# Patient Record
Sex: Male | Born: 1985 | Race: White | Hispanic: No | Marital: Single | Smoking: Former smoker
Health system: Southern US, Community
[De-identification: ages and names within clinical notes are randomized; demographics above are authoritative.]

## PROBLEM LIST (undated history)

## (undated) DIAGNOSIS — R569 Unspecified convulsions: Secondary | ICD-10-CM

---

## 2010-10-19 HISTORY — PX: BACK SURGERY: SHX140

## 2016-03-19 ENCOUNTER — Emergency Department (HOSPITAL_COMMUNITY)
Admission: EM | Admit: 2016-03-19 | Discharge: 2016-03-19 | Disposition: A | Discharge status: Home / Self Care | Payer: BLUE CROSS/BLUE SHIELD | Attending: Emergency Medicine | Admitting: Emergency Medicine

## 2016-03-19 ENCOUNTER — Encounter (HOSPITAL_COMMUNITY): Payer: Self-pay

## 2016-03-19 ENCOUNTER — Emergency Department (HOSPITAL_COMMUNITY): Payer: BLUE CROSS/BLUE SHIELD

## 2016-03-19 DIAGNOSIS — Y939 Activity, unspecified: Secondary | ICD-10-CM | POA: Insufficient documentation

## 2016-03-19 DIAGNOSIS — R569 Unspecified convulsions: Secondary | ICD-10-CM | POA: Diagnosis present

## 2016-03-19 DIAGNOSIS — S0003XA Contusion of scalp, initial encounter: Secondary | ICD-10-CM | POA: Insufficient documentation

## 2016-03-19 DIAGNOSIS — Y990 Civilian activity done for income or pay: Secondary | ICD-10-CM | POA: Insufficient documentation

## 2016-03-19 DIAGNOSIS — Y92510 Bank as the place of occurrence of the external cause: Secondary | ICD-10-CM | POA: Insufficient documentation

## 2016-03-19 DIAGNOSIS — X58XXXA Exposure to other specified factors, initial encounter: Secondary | ICD-10-CM | POA: Diagnosis not present

## 2016-03-19 DIAGNOSIS — R4020 Unspecified coma: Secondary | ICD-10-CM

## 2016-03-19 DIAGNOSIS — R55 Syncope and collapse: Secondary | ICD-10-CM | POA: Diagnosis not present

## 2016-03-19 HISTORY — DX: Unspecified convulsions: R56.9

## 2016-03-19 LAB — CBC WITH DIFFERENTIAL/PLATELET
BASOS ABS: 0 10*3/uL (ref 0.0–0.1)
Basophils Relative: 0 %
Eosinophils Absolute: 0.1 10*3/uL (ref 0.0–0.7)
Eosinophils Relative: 1 %
HEMATOCRIT: 41.7 % (ref 39.0–52.0)
HEMOGLOBIN: 14.6 g/dL (ref 13.0–17.0)
LYMPHS ABS: 1.3 10*3/uL (ref 0.7–4.0)
LYMPHS PCT: 11 %
MCH: 32.6 pg (ref 26.0–34.0)
MCHC: 35 g/dL (ref 30.0–36.0)
MCV: 93.1 fL (ref 78.0–100.0)
Monocytes Absolute: 0.4 10*3/uL (ref 0.1–1.0)
Monocytes Relative: 4 %
NEUTROS ABS: 9.7 10*3/uL — AB (ref 1.7–7.7)
NEUTROS PCT: 84 %
PLATELETS: 272 10*3/uL (ref 150–400)
RBC: 4.48 MIL/uL (ref 4.22–5.81)
RDW: 12.5 % (ref 11.5–15.5)
WBC: 11.5 10*3/uL — AB (ref 4.0–10.5)

## 2016-03-19 LAB — BASIC METABOLIC PANEL
ANION GAP: 8 (ref 5–15)
BUN: 9 mg/dL (ref 6–20)
CHLORIDE: 104 mmol/L (ref 101–111)
CO2: 23 mmol/L (ref 22–32)
Calcium: 9 mg/dL (ref 8.9–10.3)
Creatinine, Ser: 0.9 mg/dL (ref 0.61–1.24)
GFR calc Af Amer: >60 mL/min (ref >60)
Glucose, Bld: 99 mg/dL (ref 65–99)
POTASSIUM: 3.8 mmol/L (ref 3.5–5.1)
SODIUM: 135 mmol/L (ref 135–145)

## 2016-03-19 LAB — CBG MONITORING, ED: GLUCOSE-CAPILLARY: 107 mg/dL — AB (ref 65–99)

## 2016-03-19 MED ORDER — KETOROLAC TROMETHAMINE 30 MG/ML IJ SOLN
30.0000 mg | Freq: Once | INTRAMUSCULAR | Status: AC
  Administered 2016-03-19: 30 mg via INTRAVENOUS
  Filled 2016-03-19: qty 1

## 2016-03-19 MED ORDER — ONDANSETRON HCL 4 MG/2ML IJ SOLN
4.0000 mg | Freq: Once | INTRAMUSCULAR | Status: AC
  Administered 2016-03-19: 4 mg via INTRAVENOUS
  Filled 2016-03-19: qty 2

## 2016-03-19 MED ORDER — FENTANYL CITRATE (PF) 100 MCG/2ML IJ SOLN
50.0000 ug | Freq: Once | INTRAMUSCULAR | Status: AC
  Administered 2016-03-19: 50 ug via INTRAVENOUS
  Filled 2016-03-19: qty 2

## 2016-03-19 NOTE — ED Notes (Signed)
Bed: WA08 Expected date:  Expected time:  Means of arrival:  Comments: EMS- 30yo M, seizure/head injury

## 2016-03-19 NOTE — ED Notes (Signed)
Pt ambulated in hall with no assist. 

## 2016-03-19 NOTE — ED Notes (Signed)
Per EMS- patient was at work and had a seizure that lasted 1-2 minutes as per co-workers. Ptient has a history of one other seizure when he was a teenager and was never placed on medication. Patient has a hematoma to the back of his head. Patient awake and alert at this time.

## 2016-03-19 NOTE — ED Provider Notes (Signed)
CSN: [id number]     Arrival date & time 03/19/16  1221 History   First MD Initiated Contact with Patient 03/19/16 1234     Chief Complaint  Patient presents with  . Seizures    HPI   Randall Benitez is an 30 y.o. male who presents to the ED for evaluation after a presumed seizure. 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 He states he does not remember the event at all. He states all he remembers is feeling "off" all morning with some generalized weakness and lightheadedness. He states yesterday he felt fine. He states he thinks he was sitting at a desk and the next thing he remembers is waking up on the stretcher being wheeled to the ambulance. He states he felt tired and weak at tha ttime but denies confusion. He states something similar has happened once before about seven years ago and he was told he had a seizure then so he assumed he had a seizure today. He states that at that time he is unsure if he was evaluated by neurology and he was never started on seizure medicine. Per EMS, co-workers witnessed the event today and reported 1-2 minutes of what appeared to be seizure-like activity. EMS also reported that pt was post-ictal on arrival. In the ED now pt states he has a headache as he thinks he hit his head on something. States he has an area of swelling to his right pareitoccipital area. States now the pain is radiating to his forehead. Denies blurry vision, weakness, numbness, dizziness. Denies abdominal pain, n/v/d. States in the ED now he feels back to baseline.   Past Medical History  Diagnosis Date  . Seizures [facility])    Past Surgical History  Procedure Laterality Date  . Back surgery     History reviewed. No pertinent family history. Social History  Substance Use Topics  . Smoking status: Never Smoker   . Smokeless tobacco: Never Used  . Alcohol Use: Yes     Comment: occasionally    Review of Systems  All other systems reviewed and are  negative.     Allergies  Penicillins  Home Medications   Prior to Admission medications   Not on File   BP 131/66 mmHg  Pulse 71  Temp(Src) 98.1 F (36.7 C) (Oral)  Resp 18  Ht 5' 8.5" (1.74 m)  Wt 81.647 kg  BMI 26.97 kg/m2  SpO2 94% Physical Exam  Constitutional: He is oriented to person, place, and time.  HENT:  Right Ear: External ear normal.  Left Ear: External ear normal.  Nose: Nose normal.  Mouth/Throat: Oropharynx is clear and moist. No oropharyngeal exudate.  Right parietoccipital area with 3cm hematoma that is ttp. No other scalp injury visualized or palpated  Eyes: Conjunctivae and EOM are normal. Pupils are equal, round, and reactive to light.  Neck: Normal range of motion. Neck supple.  No c-spine tenderness. FROM of neck  Cardiovascular: Normal rate, regular rhythm, normal heart sounds and intact distal pulses.   Pulmonary/Chest: Effort normal and breath sounds normal. No respiratory distress. He has no wheezes. He exhibits no tenderness.  Abdominal: Soft. Bowel sounds are normal. He exhibits no distension. There is no tenderness. There is no rebound and no guarding.  Musculoskeletal: He exhibits no edema.  No midline back tenderness. No stepoff or deformity.  Neurological: He is alert and oriented to person, place, and time. He has normal reflexes. No cranial nerve  deficit.  Normal finger to nose, no pronator drift 5/5 strength bilateral UE and LE  Skin: Skin is warm and dry.  Psychiatric: He has a normal mood and affect.  Nursing note and vitals reviewed.  Orthostatic VS for the past 24 hrs:  BP- Lying Pulse- Lying BP- Sitting Pulse- Sitting BP- Standing at 0 minutes Pulse- Standing at 0 minutes  03/19/16 1505 124/70 mmHg 54 115/69 mmHg 58 124/69 mmHg 56     ED Course  Procedures (including critical care time) Labs Review Labs Reviewed  CBC WITH DIFFERENTIAL/PLATELET - Abnormal; Notable for the following:    WBC 11.5 (*)    Neutro Abs 9.7 (*)     All other components within normal limits  CBG MONITORING, ED - Abnormal; Notable for the following:    Glucose-Capillary 107 (*)    All other components within normal limits  BASIC METABOLIC PANEL    Imaging Review Ct Head Wo Contrast  03/19/2016  CLINICAL DATA:  Per EMS- patient was at work and had a seizure that lasted 1-2 minutes as per co-workers. Ptient has a history of one other seizure when he was a teenager and was never placed on medication. Patient has a hematoma to the back of his head. Patient awake and alert at this time. EXAM: CT HEAD WITHOUT CONTRAST TECHNIQUE: Contiguous axial images were obtained from the base of the skull through the vertex without intravenous contrast. COMPARISON:  None. FINDINGS: The ventricles are normal in size and configuration. There are no parenchymal masses or mass effect, no evidence of an infarct, no extra-axial masses or abnormal fluid collections and no intracranial hemorrhage. There is a right posterior parietal scalp hematoma. No skull fracture. There is significant sinus disease with opacification of the right frontal sinus and many of the ethmoid air cells bilaterally with near complete opacification of the left frontal sinus and remaining ethmoid air cells. There is mild left sphenoid sinus mucosal thickening and a small amount of dependent fluid in the right sphenoid sinus. Clear mastoid air cells. IMPRESSION: 1. No intracranial abnormality. 2. Sinus disease as described. Electronically Signed   By: Amie Portland M.D.   On: 03/19/2016 13:51   I have personally reviewed and evaluated these images and lab results as part of my medical decision-making.   EKG Interpretation   Date/Time:  Thursday March 19 2016 12:40:32 EDT Ventricular Rate:  69 PR Interval:  142 QRS Duration: 103 QT Interval:  390 QTC Calculation: 418 R Axis:   93 Text Interpretation:  Sinus rhythm Borderline right axis deviation No  previous ECGs available Confirmed by  NGUYEN, EMILY ([id number]) on 03/19/2016  12:46:47 PM      MDM   Final diagnoses:  Seizure-like activity (HCC)  Loss of consciousness    CT head with hematoma as expected but otherwise normal. EKG NSR. Labs unremarkable. Neuro exam intact. Pt reports he feels back to baseline. I suspect pt had a syncopal episode rather than true seizure. I spoke with D. Ula Lingo of neurology who agrees pt likely had a syncopal episode with myoclonic jerking. Will plan to obtain an EEG and MRI with and without contrast in the ED. If negative pt can be discharged home.  Pt has been in the ED for >4 hrs with no seizure like activity. His exam remains stable. He is ambulatory with steady gait without assistance. I spoke with MRI and apparently they are "hours" behind schedule. I also spoke with EEG team and [facility]  [facility]. Pt is nontoxic appearing and I believe stable for discharge with outpatient follow up. I do not think he has an emergent need for [facility]G. i spoke with Dr. Lavon PaganiniNandigam and he is in agreement. Outpatient EEG is scheduled for tomorrow at 9:30 AM. Otherwise instructed pt to call neurology to schedule outpatient f/u appointment within one week and he will need an outpatient MRI. Seizure precautions given including instructions to avoid driving until cleared by neuro. ER return precautions given.   Carlene CoriaSerena Y Ronya Gilcrest, PA-C 03/19/16 1628  Leta BaptistEmily Roe Nguyen, MD 03/21/16 [phone]

## 2016-03-19 NOTE — Discharge Instructions (Signed)
You were seen in the emergency room for evaluation of a possible seizure. [facility]  tom[facility]. You have an EEG scheduled for 9:30 AM. Please also call [facility] to schedule an appointment as soon as possible. You will also need an MRI as an outpatient. In the meantime be careful as you might have another episode. Avoid driving until you are cleared by a neurologist. Return to the emergency room for new or worsening symptoms.   Seizure, Adult A seizure is abnormal electrical activity in the brain. Seizures usually last from 30 seconds to 2 minutes. There are various types of seizures. Before a seizure, you may have a warning sensation (aura) that a seizure is about to occur. An aura may include the following symptoms:   Fear or anxiety.  Nausea.  Feeling like the room is spinning (vertigo).  Vision changes, such as seeing flashing lights or spots. Common symptoms during a seizure include:  A change in attention or behavior (altered mental status).  Convulsions with rhythmic jerking movements.  Drooling.  Rapid eye movements.  Grunting.  Loss of bladder and bowel control.  Bitter taste in the mouth.  Tongue biting. After a seizure, you may feel confused and sleepy. You may also have an injury resulting from convulsions during the seizure. HOME CARE INSTRUCTIONS   If you are given medicines, take them exactly as prescribed by your health care provider.  Keep all follow-up appointments as directed by your health care provider.  Do not swim or drive or engage in risky activity during which a seizure could cause further injury to you or others until your health care provider says it is OK.  Get adequate rest.  Teach friends and family what to do if you have a seizure. They should:  Lay you on the ground to prevent a fall.  Put a cushion under your head.  Loosen any tight clothing around your neck.  Turn you on your side. If vomiting  occurs, this helps keep your airway clear.  Stay with you until you recover.  Know whether or not you need emergency care. SEEK IMMEDIATE MEDICAL CARE IF:  The seizure lasts longer than 5 minutes.  The seizure is severe or you do not wake up immediately after the seizure.  You have an altered mental status after the seizure.  You are having more frequent or worsening seizures. Someone should drive you to the emergency department or call local emergency services (911 in [place] MAKE SURE YOU:  Understand these instructions.  Will watch your condition.  Will get help right away if you are not doing well or get worse.   This information is not intended to replace advice given to you by your health care provider. Make sure you discuss any questions you have with your health care provider.   Document Released: 10/02/2000 Document Revised: 10/26/2014 Document Reviewed: 05/17/2013 Elsevier Interactive Patient Education [employer]2016 Elsevier Inc.

## 2016-03-20 ENCOUNTER — Ambulatory Visit (HOSPITAL_COMMUNITY)
Admit: 2016-03-20 | Discharge: 2016-03-20 | Disposition: A | Discharge status: Home / Self Care | Payer: BLUE CROSS/BLUE SHIELD | Source: Ambulatory Visit | Attending: Emergency Medicine | Admitting: Emergency Medicine

## 2016-03-20 DIAGNOSIS — R4182 Altered mental status, unspecified: Secondary | ICD-10-CM | POA: Insufficient documentation

## 2016-03-20 DIAGNOSIS — R569 Unspecified convulsions: Secondary | ICD-10-CM | POA: Insufficient documentation

## 2016-03-20 NOTE — Progress Notes (Signed)
OP adult EEG completed, results pending. 

## 2016-03-22 NOTE — Procedures (Signed)
History: Randall Benitez is an 30 y.o. male patient with altered mental status . Routine inpatient EEG was performed for further evaluation.   Introduction:  This is a 19 channel routine scalp EEG performed at the bedside with bipolar and monopolar montages arranged in accordance to the international 10/20 system of electrode placement. One channel was dedicated to EKG recording.   Findings:  The background rhythm was normal 9-10 Hz alpha . No definite evidence of abnormal epileptiform discharges or electrographic seizures were noted during this recording.   Impression:  Unremarkable awake and drowsy routine inpatient EEG. Clinical correlation is recommended .

## 2016-03-23 ENCOUNTER — Encounter (HOSPITAL_BASED_OUTPATIENT_CLINIC_OR_DEPARTMENT_OTHER): Payer: Self-pay | Admitting: Emergency Medicine

## 2016-03-24 ENCOUNTER — Ambulatory Visit (INDEPENDENT_AMBULATORY_CARE_PROVIDER_SITE_OTHER): Payer: BLUE CROSS/BLUE SHIELD | Admitting: Neurology

## 2016-03-24 ENCOUNTER — Encounter: Payer: Self-pay | Admitting: Neurology

## 2016-03-24 VITALS — BP 123/78 | HR 55 | Ht 68.5 in | Wt 182.2 lb

## 2016-03-24 DIAGNOSIS — R569 Unspecified convulsions: Secondary | ICD-10-CM

## 2016-03-24 MED ORDER — ALPRAZOLAM 0.5 MG PO TABS: 0.5000 mg | ORAL_TABLET | Freq: Every evening | ORAL | Status: DC | PRN

## 2016-03-24 MED ORDER — DIVALPROEX SODIUM ER 500 MG PO TB24: 500.0000 mg | ORAL_TABLET | Freq: Every day | ORAL | Status: DC

## 2016-03-24 NOTE — Progress Notes (Signed)
PATIENT: Randall CatchingsSpenser Gancarz DOB: 18-Sep-1986  Chief Complaint  Patient presents with  . Seizures    He is here with his stepfather, Rocky LinkKen. Reports having a seizure, while at work, on 03/19/16.  He was sitting in his chair and his coworkers witnessed him falling to the floor with full body shakes (lasting around two minutes).  They called 911 and he was transported to the hospital.  He only remembers the ambulance ride.  States he had one other seizure 5-6 years ago.  He has never taken anticonvulsants.     HISTORICAL  Ovidio KinSpenser Clydie BraunBulmer is a 30 years old right-handed male, accompanied by his stepfather can, seen in refer by his primary care doctor  Diana EvesYuri M. Luiz IronCabeza for evaluation of seizure  He had a history of lumbar decompression surgery in 2012 for lumbar radiculopathy, he works as a Psychologist, occupationalbanker, denied alcohol illicit drug use no family history of seizure  In June first 2017 around 11:00, while at work, he had sudden onset generalized tonic-clonic seizure, bumped his right parietal region at hard surface, he complains of fatigue all over, denies tongue biting no urinary incontinence,  [facility] was [facility] to [facility], I personally reviewed CAT scan without contrast, there is evidence of right parietal skull soft tissue swelling, no intracranial abnormality, laboratory evaluation showed normal BMP, mild elevated WBC,   This is his second seizure, first seizure was around 2010, no warning signs, generalized tonic-clonic seizure,  He denies visual loss, no lateralized motor or sensory deficit, he complains of generalized fatigue, lightheadedness, mild bifrontal headaches,   REVIEW OF SYSTEMS: Full 14 system review of systems performed and notable only for increased thirst, diarrhea, memory loss, confusion, headaches, numbness, weakness, dizziness, seizure   ALLERGIES: Allergies  Allergen Reactions  . Penicillins Rash    Has patient had a PCN reaction causing immediate rash, facial/tongue/throat swelling,  SOB or lightheadedness with hypotension: No Has patient had a PCN reaction causing severe rash involving mucus membranes or skin necrosis: Yes Has patient had a PCN reaction that required hospitalization No Has patient had a PCN reaction occurring within the last 10 years: No If all of the above answers are "NO", then may proceed with Cephalosporin use.     HOME MEDICATIONS: No current outpatient prescriptions on file.   No current facility-administered medications for this visit.    PAST MEDICAL HISTORY: Past Medical History  Diagnosis Date  . Seizures (HCC)     PAST SURGICAL HISTORY: Past Surgical History  Procedure Laterality Date  . Back surgery  2012    FAMILY HISTORY: Family History  Problem Relation Age of Onset  . Migraines Mother   . Diabetes Maternal Grandfather     SOCIAL HISTORY:  Social History   Social History  . Marital Status: Single    Spouse Name: N/A  . Number of Children: 0  . Years of Education: HS   Occupational History  . Bank Teller    Social History Main Topics  . Smoking status: Former Smoker    Types: Cigarettes  . Smokeless tobacco: Never Used  . Alcohol Use: 0.0 oz/week    0 Standard drinks or equivalent per week     Comment: occasionally  . Drug Use: No  . Sexual Activity: Not on file   Other Topics Concern  . Not on file   Social History Narrative   Lives at home with two roommates.   Right-handed.   No caffeine use.  PHYSICAL EXAM   Filed Vitals:   03/24/16 1328  BP: 123/78  Pulse: 55  Height: 5' 8.5" (1.74 m)  Weight: 182 lb 4 oz (82.668 kg)    Not recorded      Body mass index is 27.3 kg/(m^2).  PHYSICAL EXAMNIATION:  Gen: NAD, conversant, well nourised, obese, well groomed                     Cardiovascular: Regular rate rhythm, no peripheral edema, warm, nontender. Eyes: Conjunctivae clear without exudates or hemorrhage Neck: Supple, no carotid bruise. Pulmonary: Clear to auscultation  bilaterally   NEUROLOGICAL EXAM:  MENTAL STATUS: Speech:    Speech is normal; fluent and spontaneous with normal comprehension.  Cognition:     Orientation to time, place and person     Normal recent and remote memory     Normal Attention span and concentration     Normal Language, naming, repeating,spontaneous speech     Fund of knowledge   CRANIAL NERVES: CN II: Visual fields are full to confrontation. Fundoscopic exam is normal with sharp discs and no vascular changes. Pupils are round equal and briskly reactive to light. CN III, IV, VI: extraocular movement are normal. No ptosis. CN V: Facial sensation is intact to pinprick in all 3 divisions bilaterally. Corneal responses are intact.  CN VII: Face is symmetric with normal eye closure and smile. CN VIII: Hearing is normal to rubbing fingers CN IX, X: Palate elevates symmetrically. Phonation is normal. CN XI: Head turning and shoulder shrug are intact CN XII: Tongue is midline with normal movements and no atrophy.  MOTOR: There is no pronator drift of out-stretched arms. Muscle bulk and tone are normal. Muscle strength is normal.  REFLEXES: Reflexes are 2+ and symmetric at the biceps, triceps, knees, and ankles. Plantar responses are flexor.  SENSORY: Intact to light touch, pinprick, positional sensation and vibratory sensation are intact in fingers and toes.  COORDINATION: Rapid alternating movements and fine finger movements are intact. There is no dysmetria on finger-to-nose and heel-knee-shin.    GAIT/STANCE: Posture is normal. Gait is steady with normal steps, base, arm swing, and turning. Heel and toe walking are normal. Tandem gait is normal.  Romberg is absent.   DIAGNOSTIC DATA (LABS, IMAGING, TESTING) - I reviewed patient records, labs, notes, testing and imaging myself where available.   ASSESSMENT AND PLAN  Ernestine Langworthy is a 30 y.o. male   Generalized epilepsy  Seizure twice, in 2010, most recent one  was June first 2017  Complete evaluation with MRI of the brain with and without contrast  EEG  Laboratory evaluation including TSH, B12, liver functional test has baseline  No driving until seizure free for 6 months  Start Depakote ER 500 mg once every night  Levert Feinstein, M.D. Ph.D.  [facility] [address] [place], [place] [address] Ph: [phone] Fax: [phone]  CC:  Diana Eves. Luiz Iron, MD

## 2016-03-25 ENCOUNTER — Ambulatory Visit (INDEPENDENT_AMBULATORY_CARE_PROVIDER_SITE_OTHER): Payer: BLUE CROSS/BLUE SHIELD | Admitting: Neurology

## 2016-03-25 DIAGNOSIS — R569 Unspecified convulsions: Secondary | ICD-10-CM

## 2016-03-25 LAB — HEPATIC FUNCTION PANEL
ALBUMIN: 4.9 g/dL (ref 3.5–5.5)
ALT: 15 IU/L (ref 0–44)
AST: 15 IU/L (ref 0–40)
Alkaline Phosphatase: 63 IU/L (ref 39–117)
BILIRUBIN TOTAL: 0.4 mg/dL (ref 0.0–1.2)
BILIRUBIN, DIRECT: 0.11 mg/dL (ref 0.00–0.40)
TOTAL PROTEIN: 7.2 g/dL (ref 6.0–8.5)

## 2016-03-25 LAB — VITAMIN D 25 HYDROXY (VIT D DEFICIENCY, FRACTURES): VIT D 25 HYDROXY: 29.2 ng/mL — AB (ref 30.0–100.0)

## 2016-03-25 LAB — VITAMIN B12: Vitamin B-12: 636 pg/mL (ref 211–946)

## 2016-03-25 LAB — TSH: TSH: 1.02 u[IU]/mL (ref 0.450–4.500)

## 2016-03-25 LAB — RPR: RPR: NONREACTIVE

## 2016-03-25 LAB — FOLATE: FOLATE: 7.1 ng/mL (ref >3.0)

## 2016-03-26 ENCOUNTER — Telehealth: Payer: Self-pay | Admitting: Neurology

## 2016-03-26 NOTE — Telephone Encounter (Signed)
EEG was abnormal, showed evidence of generalized epileptiform discharge.  He should continue Depakote ER 500mg  one tablet every night, no driving until seizure free for 6 months.  I called, was not able to reach patient above result, left message for him to call back  Elon JesterMichele, please call patient again to deliver the EEG result

## 2016-03-26 NOTE — Telephone Encounter (Signed)
I was able to reach patient's stepfather on [employer] Randall Link(Ken) - reviewed results with him.  He verbalized understanding and will discuss with patient.  Invited them to call back with any further questions or concerns.

## 2016-03-26 NOTE — Procedures (Signed)
   HISTORY: 30 years old male had recurrent seizure in June first 2017, and in 2010  TECHNIQUE:  16 channel EEG was performed based on standard 10-16 international system. One channel was dedicated to EKG, which has demonstrates normal sinus rhythm of 66 beats per minutes.  Upon awakening, the posterior background activity was well-developed, in alpha range, 11 Hz, reactive to eye opening and closure. There was occasionally short protrusion of spikes slow waves.  Photic stimulation was not performed.  Hyperventilation was performed, during hyperventilation, there was frequent appearance of the semi-arrhythmic theta high amplitude activity, at the end of hyperventilation, there was also short lasting 3 Hz, bilateral frontal dominant spike slow waves,  No sleep was achieved.  CONCLUSION: This is an abnormal EEG.  There is electronic diagnostic evidence of generalized epileptiform discharge, most obvious during hyperventilation. Above findings is consistent with a diagnosis of idiopathic generalized epilepsy disorder.

## 2016-03-27 NOTE — Telephone Encounter (Signed)
Spoke to VF CorporationSpenser - he is aware of results and understands no driving until 6 months seizure free ([place] law).  He will continue his medication and keep his follow up appointment.

## 2016-03-27 NOTE — Telephone Encounter (Addendum)
Patient returned Randall Benitez's call. Please call Cell# [phone] or Work# [phone], states sometimes cell phone signal isn't very good at work.

## 2016-04-02 ENCOUNTER — Telehealth: Payer: Self-pay | Admitting: Neurology

## 2016-04-02 NOTE — Telephone Encounter (Signed)
Pt returned Danielle's call.   Also for RN, pt said he forgot to take the  divalproex (DEPAKOTE ER) 500 MG 24 hr tablet last night. He did not take it before he went to work today. What should he do? Operator advised pt to call pharmacy but he wanted to speak with RN

## 2016-04-02 NOTE — Telephone Encounter (Signed)
Returned call to pt and left VM mssg. Instructed pt that since it's almost time for next dose, skip the missed dose and go back to regular dosing schedule. Do not take a double dose. May call back w/ any additional questions/concerns.

## 2016-04-03 ENCOUNTER — Telehealth: Payer: Self-pay | Admitting: Neurology

## 2016-04-03 NOTE — Telephone Encounter (Signed)
Pt's father returned Danielle's call at 8:07 this morning (on another tele note) and has called back. Please call

## 2016-04-03 NOTE — Telephone Encounter (Signed)
Pt's father returned Danielle's call

## 2016-04-06 ENCOUNTER — Telehealth: Payer: Self-pay | Admitting: Neurology

## 2016-04-06 NOTE — Telephone Encounter (Signed)
Spoke with patients father and scheduled apt. Please refer to previous phone note.  °

## 2016-04-06 NOTE — Telephone Encounter (Signed)
Spoke with patients father and scheduled apt.

## 2016-04-06 NOTE — Telephone Encounter (Signed)
Spoke with patients father and scheduled apt. Please refer to previous phone note.

## 2016-04-06 NOTE — Telephone Encounter (Signed)
Pt called in to schedule MRI. Would like a call back on Work # : [phone] He does not get good reception with his cell phone.

## 2016-04-15 ENCOUNTER — Ambulatory Visit (INDEPENDENT_AMBULATORY_CARE_PROVIDER_SITE_OTHER): Payer: BLUE CROSS/BLUE SHIELD

## 2016-04-15 DIAGNOSIS — R569 Unspecified convulsions: Secondary | ICD-10-CM | POA: Diagnosis not present

## 2016-04-16 MED ORDER — GADOPENTETATE DIMEGLUMINE 469.01 MG/ML IV SOLN: 17.0000 mL | Freq: Once | INTRAVENOUS | Status: AC | PRN

## 2016-04-28 ENCOUNTER — Encounter: Payer: Self-pay | Admitting: Neurology

## 2016-04-28 ENCOUNTER — Ambulatory Visit (INDEPENDENT_AMBULATORY_CARE_PROVIDER_SITE_OTHER): Payer: BLUE CROSS/BLUE SHIELD | Admitting: Neurology

## 2016-04-28 VITALS — BP 123/78 | HR 60 | Ht 68.5 in | Wt 182.0 lb

## 2016-04-28 DIAGNOSIS — R569 Unspecified convulsions: Secondary | ICD-10-CM

## 2016-04-28 DIAGNOSIS — J329 Chronic sinusitis, unspecified: Secondary | ICD-10-CM

## 2016-04-28 MED ORDER — DIVALPROEX SODIUM ER 500 MG PO TB24: 500.0000 mg | ORAL_TABLET | Freq: Every day | ORAL | Status: DC

## 2016-04-28 NOTE — Progress Notes (Addendum)
Chief Complaint  Patient presents with  . Seizures    He is here with his mother, Raynelle Fanning and his grandmother, Harriett Sine.  They would like to discuss his MRI, EEG and labs.  He is doing well on Depakote and denies any further seizure activity.       PATIENT: Randall Benitez DOB: 05/07/1986  Chief Complaint  Patient presents with  . Seizures    He is here with his mother, Raynelle Fanning and his grandmother, Harriett Sine.  They would like to discuss his MRI, EEG and labs.  He is doing well on Depakote and denies any further seizure activity.      HISTORICAL  Randall Benitez is a 30 years old right-handed male, accompanied by his stepfather seen in refer by his primary care doctor  Diana Eves. Luiz Iron for evaluation of seizure  He had a history of lumbar decompression surgery in 2012 for lumbar radiculopathy, he works as a Psychologist, occupational, denied alcohol illicit drug use no family history of seizure  In June first 2017 around 11:00, while at work, he had sudden onset generalized tonic-clonic seizure, bumped his right parietal region at hard surface, he complains of fatigue all over, denies tongue biting no urinary incontinence,  He was taken to [facility], I personally reviewed CAT scan without contrast, there is evidence of right parietal skull soft tissue swelling, no intracranial abnormality, laboratory evaluation showed normal BMP, mild elevated WBC,   This is his second seizure, first seizure was around 2010, no warning signs, generalized tonic-clonic seizure,  He denies visual loss, no lateralized motor or sensory deficit, he complains of generalized fatigue, lightheadedness, mild bifrontal headaches,  UPDATE April 28 2016: I have personally reviewed MRI of the brain with and without contrast with patient and his family on June 2017, no intracranial abnormality, evidence of diffuse sinusitis, with fluid level, he does complains symptoms of sinus, I will refer him to ENT,  He is able to tolerate Depakote ER 500 mg  every night, he has no recurrent seizure, reviewed EEG, evidence of generalized epileptiform discharge.  We reviewed laboratory in June 2017, mild elevated WBC 11 point 5, normal CMP, TSH, B12,  REVIEW OF SYSTEMS: Full 14 system review of systems performed and notable only for Environmental allergy, insomnia  ALLERGIES: Allergies  Allergen Reactions  . Penicillins Rash    Has patient had a PCN reaction causing immediate rash, facial/tongue/throat swelling, SOB or lightheadedness with hypotension: No Has patient had a PCN reaction causing severe rash involving mucus membranes or skin necrosis: Yes Has patient had a PCN reaction that required hospitalization No Has patient had a PCN reaction occurring within the last 10 years: No If all of the above answers are "NO", then may proceed with Cephalosporin use.     HOME MEDICATIONS: Current Outpatient Prescriptions  Medication Sig Dispense Refill  . divalproex (DEPAKOTE ER) 500 MG 24 hr tablet Take 1 tablet (500 mg total) by mouth at bedtime. 30 tablet 11   No current facility-administered medications for this visit.   Facility-Administered Medications Ordered in Other Visits  Medication Dose Route Frequency Provider Last Rate Last Dose  . gadopentetate dimeglumine (MAGNEVIST) injection 17 mL  17 mL Intravenous Once PRN Levert Feinstein, MD        PAST MEDICAL HISTORY: Past Medical History  Diagnosis Date  . Seizures (HCC)     PAST SURGICAL HISTORY: Past Surgical History  Procedure Laterality Date  . Back surgery  2012    FAMILY HISTORY: Family History  Problem Relation Age of Onset  . Migraines Mother   . Diabetes Maternal Grandfather     SOCIAL HISTORY:  Social History   Social History  . Marital Status: Single    Spouse Name: N/A  . Number of Children: 0  . Years of Education: HS   Occupational History  . Bank Teller    Social History Main Topics  . Smoking status: Former Smoker    Types: Cigarettes  . Smokeless  tobacco: Never Used  . Alcohol Use: 0.0 oz/week    0 Standard drinks or equivalent per week     Comment: occasionally  . Drug Use: No  . Sexual Activity: Not on file   Other Topics Concern  . Not on file   Social History Narrative   Lives at home with two roommates.   Right-handed.   No caffeine use.        PHYSICAL EXAM   Filed Vitals:   04/28/16 0947  BP: 123/78  Pulse: 60  Height: 5' 8.5" (1.74 m)  Weight: 182 lb (82.555 kg)    Not recorded      Body mass index is 27.27 kg/(m^2).  PHYSICAL EXAMNIATION:  Gen: NAD, conversant, well nourised, obese, well groomed                     Cardiovascular: Regular rate rhythm, no peripheral edema, warm, nontender. Eyes: Conjunctivae clear without exudates or hemorrhage Neck: Supple, no carotid bruise. Pulmonary: Clear to auscultation bilaterally   NEUROLOGICAL EXAM:  MENTAL STATUS: Speech:    Speech is normal; fluent and spontaneous with normal comprehension.  Cognition:     Orientation to time, place and person     Normal recent and remote memory     Normal Attention span and concentration     Normal Language, naming, repeating,spontaneous speech     Fund of knowledge   CRANIAL NERVES: CN II: Visual fields are full to confrontation. Fundoscopic exam is normal with sharp discs and no vascular changes. Pupils are round equal and briskly reactive to light. CN III, IV, VI: extraocular movement are normal. No ptosis. CN V: Facial sensation is intact to pinprick in all 3 divisions bilaterally. Corneal responses are intact.  CN VII: Face is symmetric with normal eye closure and smile. CN VIII: Hearing is normal to rubbing fingers CN IX, X: Palate elevates symmetrically. Phonation is normal. CN XI: Head turning and shoulder shrug are intact CN XII: Tongue is midline with normal movements and no atrophy.  MOTOR: There is no pronator drift of out-stretched arms. Muscle bulk and tone are normal. Muscle strength is  normal.  REFLEXES: Reflexes are 2+ and symmetric at the biceps, triceps, knees, and ankles. Plantar responses are flexor.  SENSORY: Intact to light touch, pinprick, positional sensation and vibratory sensation are intact in fingers and toes.  COORDINATION: Rapid alternating movements and fine finger movements are intact. There is no dysmetria on finger-to-nose and heel-knee-shin.    GAIT/STANCE: Posture is normal. Gait is steady with normal steps, base, arm swing, and turning. Heel and toe walking are normal. Tandem gait is normal.  Romberg is absent.   DIAGNOSTIC DATA (LABS, IMAGING, TESTING) - I reviewed patient records, labs, notes, testing and imaging myself where available.  ASSESSMENT AND PLAN  Brain Honeycutt is a 30 y.o. male   Generalized epilepsy  Seizure twice, in 2010, most recent one was June first 2017  MRI of the brain with and without contrast showed no significant  intracranial abnormality, there is evidence of sinusitis  EEG showed evidence of generalized epileptiform discharge.  Continue Depakote ER 500 mg today,  Laboratory evaluations  No driving until seizure free for 6 months  Chronic sinusitis  Refer him to ENT  Levert FeinsteinYijun Berkeley Vanaken, M.D. Ph.D.  [facility] [address], [address] [place], [place] [address] Ph: [phone] Fax: [phone]  CC:  Diana EvesYuri M. Luiz Ironabeza, MD

## 2016-04-28 NOTE — Patient Instructions (Signed)
[facility] [address] [place], [place] [address] Phone [phone] Fax [phone] Today's Hours

## 2016-04-29 LAB — COMPREHENSIVE METABOLIC PANEL
A/G RATIO: 1.8 (ref 1.2–2.2)
ALK PHOS: 69 IU/L (ref 39–117)
ALT: 20 IU/L (ref 0–44)
AST: 16 IU/L (ref 0–40)
Albumin: 4.6 g/dL (ref 3.5–5.5)
BILIRUBIN TOTAL: 0.3 mg/dL (ref 0.0–1.2)
BUN/Creatinine Ratio: 18 (ref 9–20)
BUN: 17 mg/dL (ref 6–20)
CALCIUM: 9.9 mg/dL (ref 8.7–10.2)
CHLORIDE: 98 mmol/L (ref 96–106)
CO2: 23 mmol/L (ref 18–29)
Creatinine, Ser: 0.97 mg/dL (ref 0.76–1.27)
GFR calc Af Amer: 123 mL/min/{1.73_m2} (ref >59)
GFR calc non Af Amer: 107 mL/min/{1.73_m2} (ref >59)
GLOBULIN, TOTAL: 2.5 g/dL (ref 1.5–4.5)
Glucose: 87 mg/dL (ref 65–99)
POTASSIUM: 5.4 mmol/L — AB (ref 3.5–5.2)
SODIUM: 138 mmol/L (ref 134–144)
Total Protein: 7.1 g/dL (ref 6.0–8.5)

## 2016-04-29 LAB — CBC
HEMATOCRIT: 47.6 % (ref 37.5–51.0)
Hemoglobin: 16.3 g/dL (ref 12.6–17.7)
MCH: 33 pg (ref 26.6–33.0)
MCHC: 34.2 g/dL (ref 31.5–35.7)
MCV: 96 fL (ref 79–97)
PLATELETS: 341 10*3/uL (ref 150–379)
RBC: 4.94 x10E6/uL (ref 4.14–5.80)
RDW: 13.5 % (ref 12.3–15.4)
WBC: 8.6 10*3/uL (ref 3.4–10.8)

## 2016-05-05 ENCOUNTER — Other Ambulatory Visit: Payer: Self-pay | Admitting: *Deleted

## 2016-05-05 ENCOUNTER — Telehealth: Payer: Self-pay | Admitting: Neurology

## 2016-05-05 MED ORDER — DIVALPROEX SODIUM ER 500 MG PO TB24: 1000.0000 mg | ORAL_TABLET | Freq: Every day | ORAL | Status: AC

## 2016-05-05 NOTE — Telephone Encounter (Signed)
Mother Randall Benitez [phone] called to advise, Son was feeling drained, legs feel heavy, he keeps saying "there's something wrong", states she had to go pick him up at work yesterday, states when he had seizure previously, he felt strange prior to seizure.

## 2016-05-05 NOTE — Telephone Encounter (Signed)
Pt's mother called to check on status about an appt today. Please call

## 2016-05-05 NOTE — Telephone Encounter (Signed)
Per Dr. Terrace ArabiaYan, increase Depakote ER to 1000mg , qhs.  She would like him to call back if symptoms persist and get an appt w/ NP (may also need labs).  Returned call - spoke to his mother (on HIPPA) who verbalized understanding and is agreeable to this plan.  New rx, reflecting increased dose, sent to the pharmacy.

## 2016-05-14 ENCOUNTER — Ambulatory Visit: Payer: BLUE CROSS/BLUE SHIELD | Admitting: Neurology

## 2016-08-21 ENCOUNTER — Telehealth: Payer: Self-pay | Admitting: Neurology

## 2016-08-21 DIAGNOSIS — G40309 Generalized idiopathic epilepsy and epileptic syndromes, not intractable, without status epilepticus: Secondary | ICD-10-CM

## 2016-08-21 NOTE — Telephone Encounter (Signed)
Fail to reach patient by office and cellphone, left message, for him to call back if he has recurrent spells, he needs to call back our office, may consider higher dose of Depakote such as 500 mg 3 tablets every night  Elon JesterMichele, please call and check on him again

## 2016-08-21 NOTE — Telephone Encounter (Signed)
Pt called to advise he had double vision today while driving, he reports having tunnel vision on Wednesday 08/19/16 and almost passed out and then got "violently sick". He is wanting to know if any of his medications should be changed. Please call

## 2016-08-21 NOTE — Telephone Encounter (Signed)
I have spoken with Randall Benitez this morning.  He sts. he has had 3 episodes over the last 3 days--lasting approx. . each. Sudden onset of tunnel or blurry visoin, n/v, feels as if he is going to pass out, then is fine.  Unable to identify a trigger.  No disturbed cognition or postictal period.    No missed doses of medicine.  Sts. he is not able to come in this am for labs before our office closes, but will come in on Monday//fim

## 2016-08-21 NOTE — Telephone Encounter (Signed)
Chart reviewed, patient have generalized epilepsy disorder, most recent seizure was in July 2017, is now on Depakote ER 500 mg 2 tablets every night,  Please call him to get details the episode, I also ordered Depakote level and laboratory evaluation he may come in without appointment,

## 2016-08-24 ENCOUNTER — Other Ambulatory Visit (INDEPENDENT_AMBULATORY_CARE_PROVIDER_SITE_OTHER): Payer: Self-pay

## 2016-08-24 DIAGNOSIS — G40309 Generalized idiopathic epilepsy and epileptic syndromes, not intractable, without status epilepticus: Secondary | ICD-10-CM

## 2016-08-24 DIAGNOSIS — Z0289 Encounter for other administrative examinations: Secondary | ICD-10-CM

## 2016-08-24 NOTE — Telephone Encounter (Signed)
Spoke to patient - he has not had any further events since last week.  Says he started the increased dose of Depakote ER 500mg , 3 capsules at bedtime on Friday.  He felt nauseated for the first couple of days but is now feeling better.  He came to our office today for lab work (results pending).  I reminded him of the [place] law and that he should not be driving right now.  He verbalized understanding.

## 2016-08-25 LAB — COMPREHENSIVE METABOLIC PANEL
A/G RATIO: 2.1 (ref 1.2–2.2)
ALT: 34 IU/L (ref 0–44)
AST: 19 IU/L (ref 0–40)
Albumin: 4.5 g/dL (ref 3.5–5.5)
Alkaline Phosphatase: 60 IU/L (ref 39–117)
BILIRUBIN TOTAL: 0.3 mg/dL (ref 0.0–1.2)
BUN/Creatinine Ratio: 20 (ref 9–20)
BUN: 18 mg/dL (ref 6–20)
CALCIUM: 9.8 mg/dL (ref 8.7–10.2)
CHLORIDE: 98 mmol/L (ref 96–106)
CO2: 23 mmol/L (ref 18–29)
Creatinine, Ser: 0.88 mg/dL (ref 0.76–1.27)
GFR, EST AFRICAN AMERICAN: 135 mL/min/{1.73_m2} (ref >59)
GFR, EST NON AFRICAN AMERICAN: 117 mL/min/{1.73_m2} (ref >59)
GLOBULIN, TOTAL: 2.1 g/dL (ref 1.5–4.5)
Glucose: 103 mg/dL — ABNORMAL HIGH (ref 65–99)
POTASSIUM: 4.7 mmol/L (ref 3.5–5.2)
SODIUM: 138 mmol/L (ref 134–144)
Total Protein: 6.6 g/dL (ref 6.0–8.5)

## 2016-08-25 LAB — CBC
Hematocrit: 44 % (ref 37.5–51.0)
Hemoglobin: 15.1 g/dL (ref 12.6–17.7)
MCH: 32.3 pg (ref 26.6–33.0)
MCHC: 34.3 g/dL (ref 31.5–35.7)
MCV: 94 fL (ref 79–97)
PLATELETS: 297 10*3/uL (ref 150–379)
RBC: 4.67 x10E6/uL (ref 4.14–5.80)
RDW: 13.6 % (ref 12.3–15.4)
WBC: 7.5 10*3/uL (ref 3.4–10.8)

## 2016-08-25 LAB — VALPROIC ACID LEVEL: Valproic Acid Lvl: 20 ug/mL — ABNORMAL LOW (ref 50–100)

## 2016-10-29 ENCOUNTER — Ambulatory Visit: Payer: BLUE CROSS/BLUE SHIELD | Admitting: Adult Health

## 2017-01-04 IMAGING — CT CT HEAD W/O CM
3 of 4 series · 14 of 47 positions shown, 16 images · non-contrast
Comparison: None.

CLINICAL DATA: Per EMS- patient was at work and had a seizure that
lasted 1-2 minutes as per co-workers. Ptient has a history of one
other seizure when he was a teenager and was never placed on
medication. Patient has a hematoma to the back of his head. Patient
awake and alert at this time.

EXAM:
CT HEAD WITHOUT CONTRAST
TECHNIQUE: Contiguous axial images were obtained from the base of the skull
through the vertex without intravenous contrast.

[Series 2: head w/o · axial · non-contrast · 0.46mm/px · z∈[-169,-49]mm · 8 of 30 slices shown, 10 images]
[im 3/30  brain]
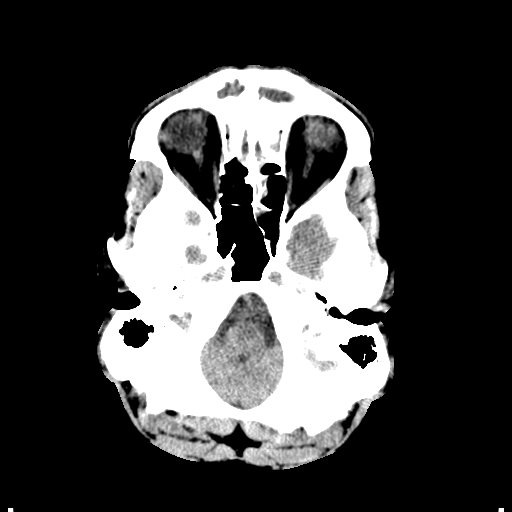
[im 3/30  bone]
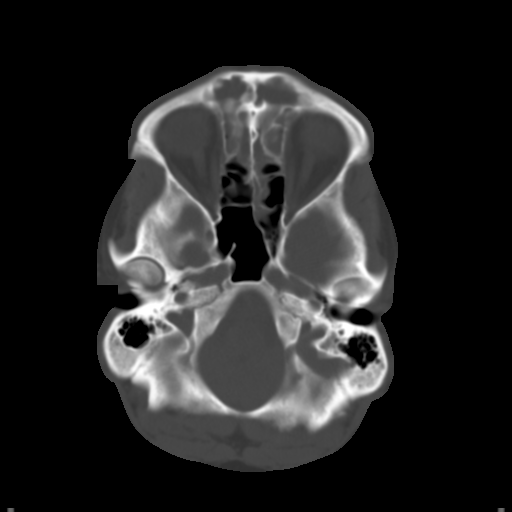
[im 6/30  brain]
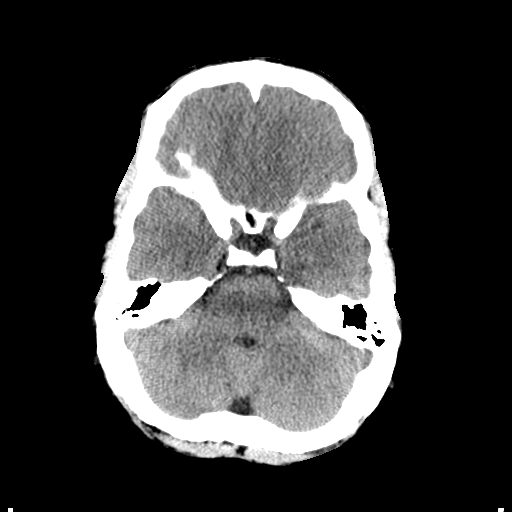
[im 9/30  brain]
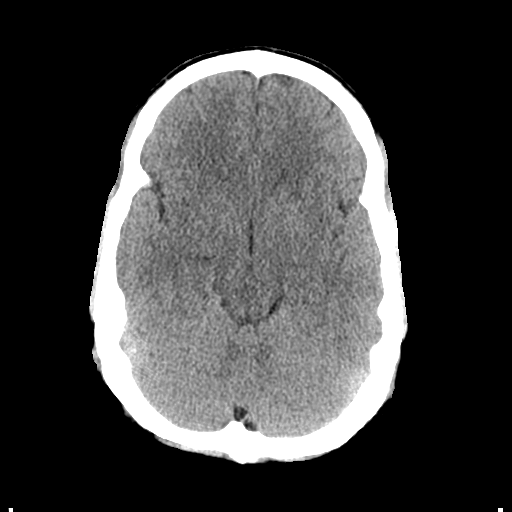
[im 12/30  brain]
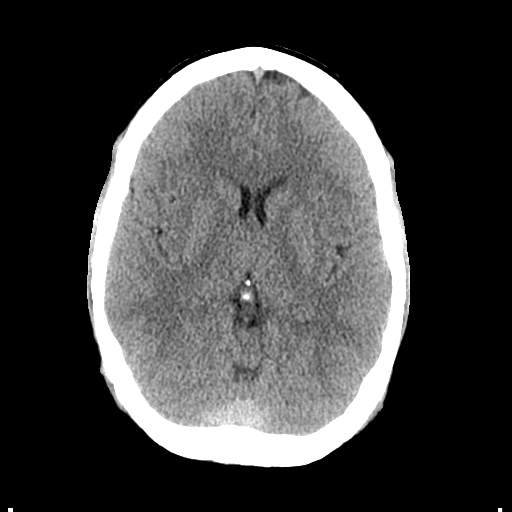
[im 18/30  brain]
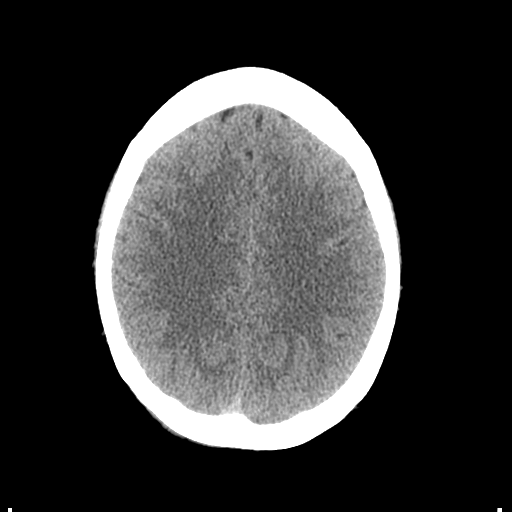
[im 18/30  bone]
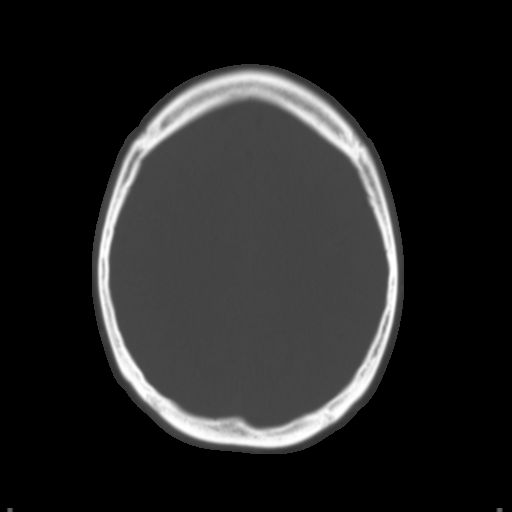
[im 21/30  brain]
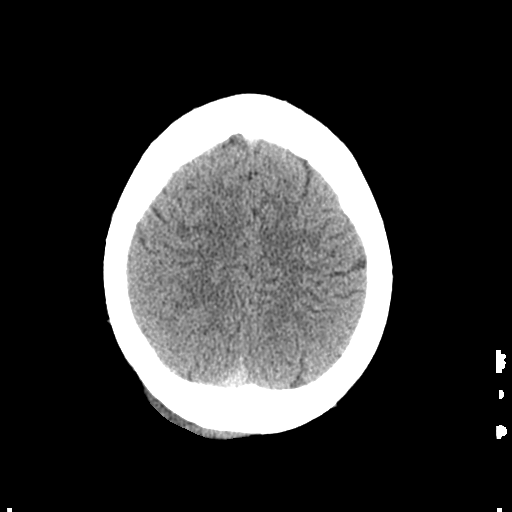
[im 24/30  brain]
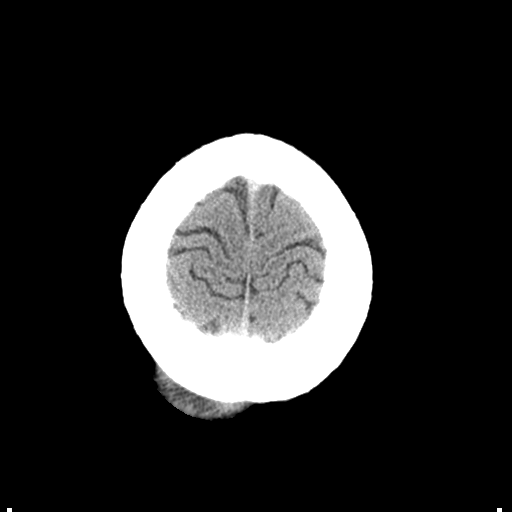
[im 27/30  brain]
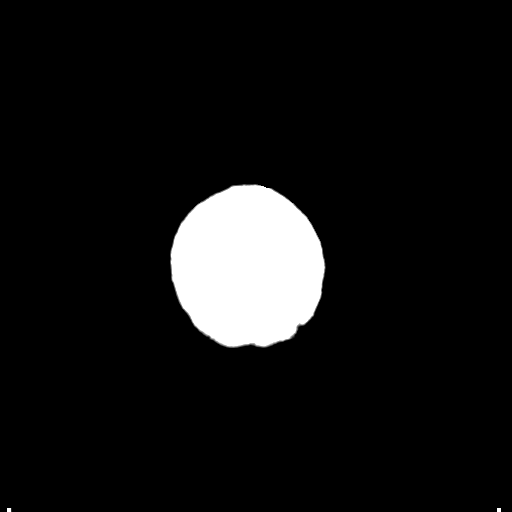

[Series 5: coronal · coronal · 0.31mm/px · 3 of 75 slices shown]
[im 25/75  brain]
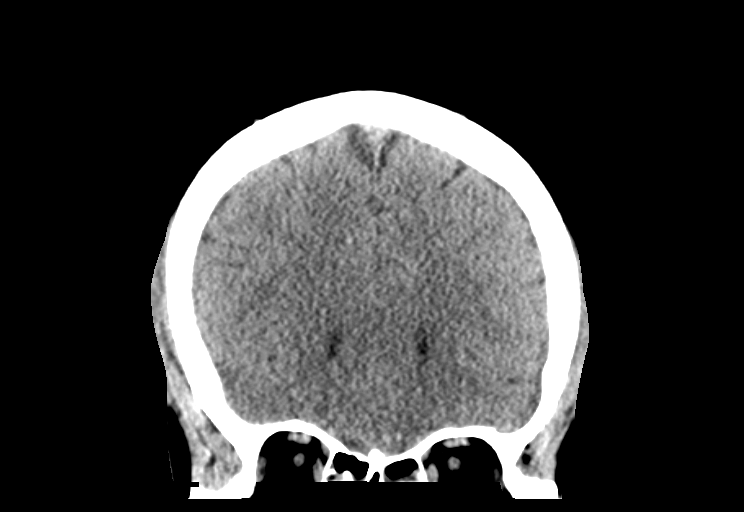
[im 33/75  brain]
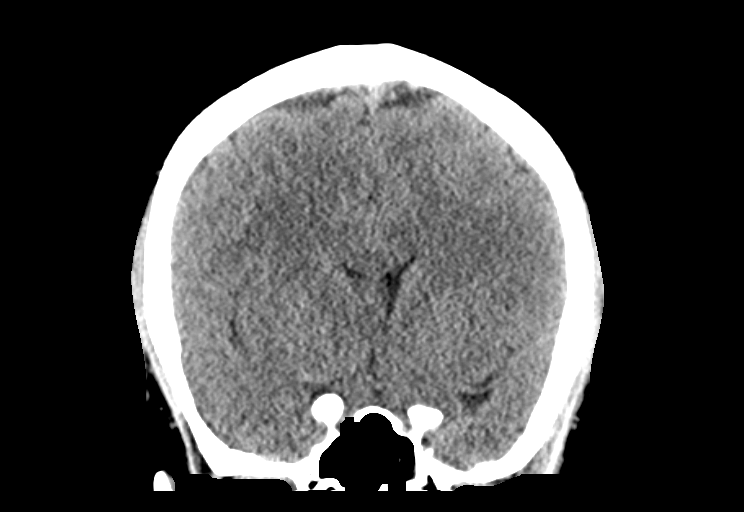
[im 42/75  brain]
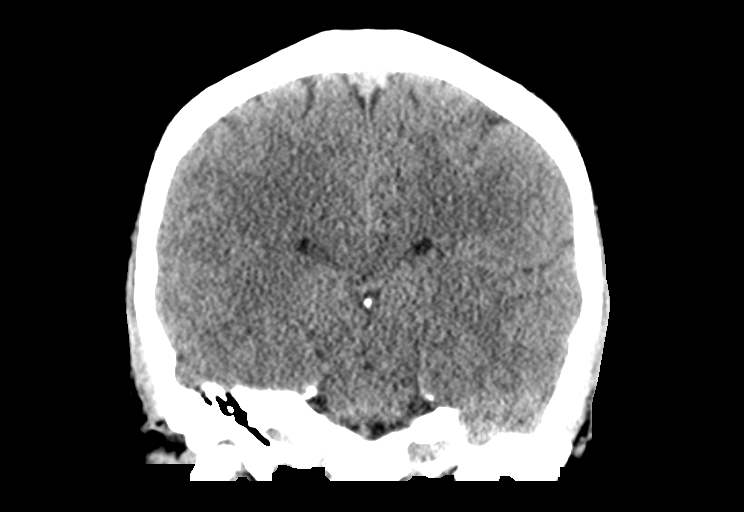

[Series 6: sagittal · sagittal · 0.32mm/px · 3 of 77 slices shown]
[im 26/77  brain]
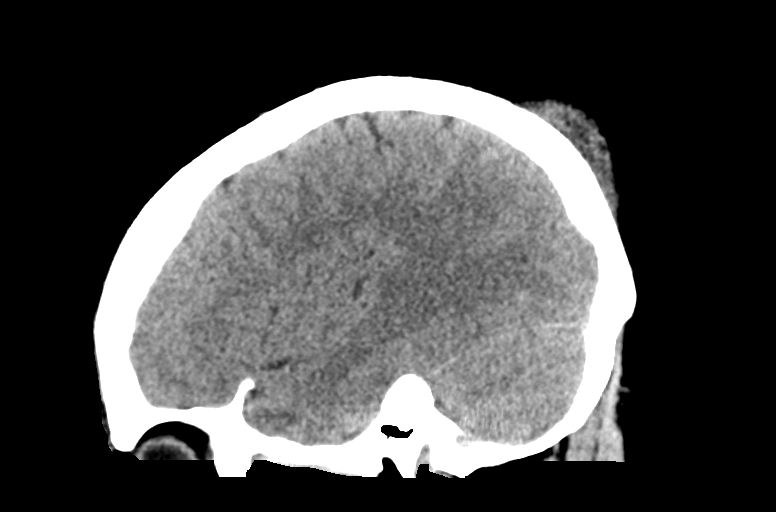
[im 39/77  brain]
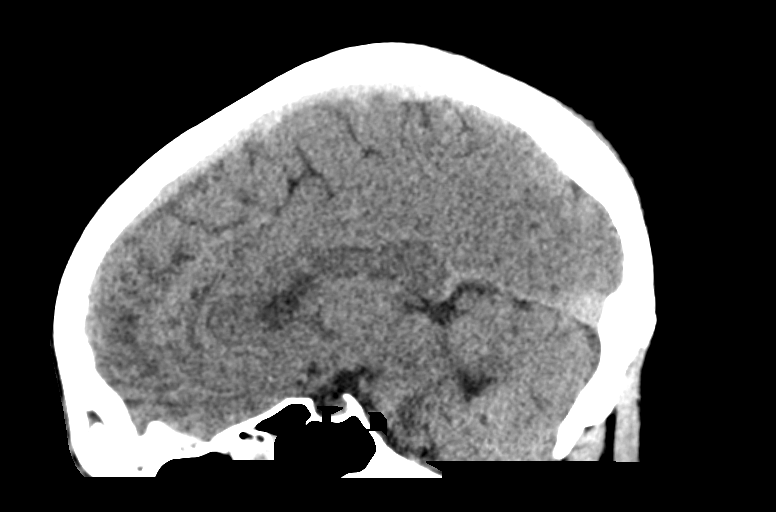
[im 51/77  brain]
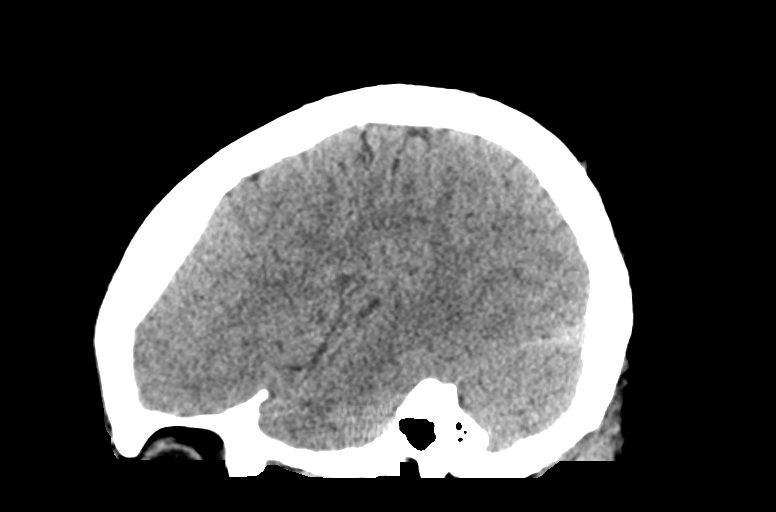

[14 of 47 positions shown; findings below may reference images not displayed]

FINDINGS: The ventricles are normal in size and configuration.

There are no parenchymal masses or mass effect, no evidence of an
infarct, no extra-axial masses or abnormal fluid collections and no
intracranial hemorrhage.

There is a right posterior parietal scalp hematoma. No skull
fracture.

There is significant sinus disease with opacification of the right
frontal sinus and many of the ethmoid air cells bilaterally with
near complete opacification of the left frontal sinus and remaining
ethmoid air cells. There is mild left sphenoid sinus mucosal
thickening and a small amount of dependent fluid in the right
sphenoid sinus. Clear mastoid air cells.
IMPRESSION: 1. No intracranial abnormality.
2. Sinus disease as described.

## 2020-03-07 ENCOUNTER — Other Ambulatory Visit: Payer: Self-pay | Admitting: Family Medicine

## 2020-03-07 DIAGNOSIS — R1012 Left upper quadrant pain: Secondary | ICD-10-CM

## 2020-03-26 ENCOUNTER — Ambulatory Visit
Admission: RE | Admit: 2020-03-26 | Discharge: 2020-03-26 | Disposition: A | Discharge status: Home / Self Care | Payer: BC Managed Care – PPO | Source: Ambulatory Visit | Attending: Family Medicine | Admitting: Family Medicine

## 2020-03-26 DIAGNOSIS — R1012 Left upper quadrant pain: Secondary | ICD-10-CM

## 2020-03-26 MED ORDER — IOPAMIDOL (ISOVUE-300) INJECTION 61%
100.0000 mL | Freq: Once | INTRAVENOUS | Status: AC | PRN
  Administered 2020-03-26: 100 mL via INTRAVENOUS
